# Patient Record
Sex: Male | Born: 2005 | Race: Black or African American | Hispanic: No | Marital: Single
Health system: Southern US, Community
[De-identification: ages and names within clinical notes are randomized; demographics above are authoritative.]

---
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
## 2023-04-14 IMAGING — US US SCROTUM W/ DOPPLER COMPLETE
1 series · 14 of 25 positions shown · non-contrast
Comparison: None.

CLINICAL DATA: Right groin pain

EXAM:
SCROTAL ULTRASOUND
DOPPLER ULTRASOUND OF THE TESTICLES
TECHNIQUE: Complete ultrasound examination of the testicles, epididymis, and
other scrotal structures was performed. Color and spectral Doppler
ultrasound were also utilized to evaluate blood flow to the
testicles.

[Series 1: us scrotum w/doppler · 14 of 163 slices shown]
[im 1/163]
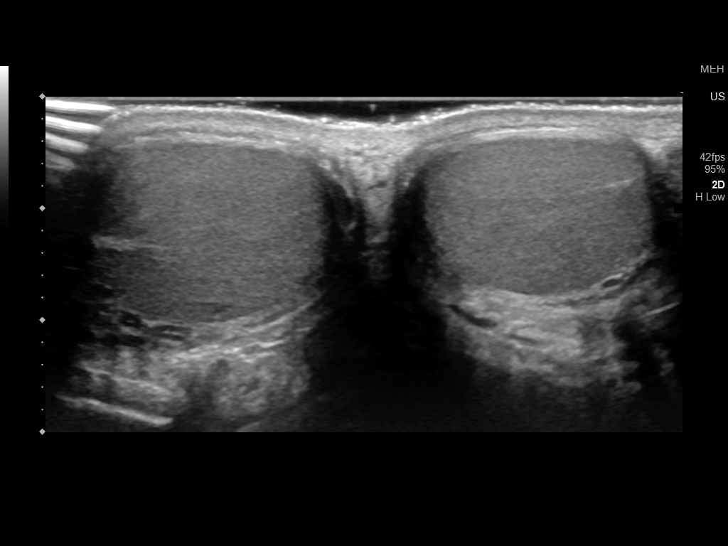
[im 14/163]
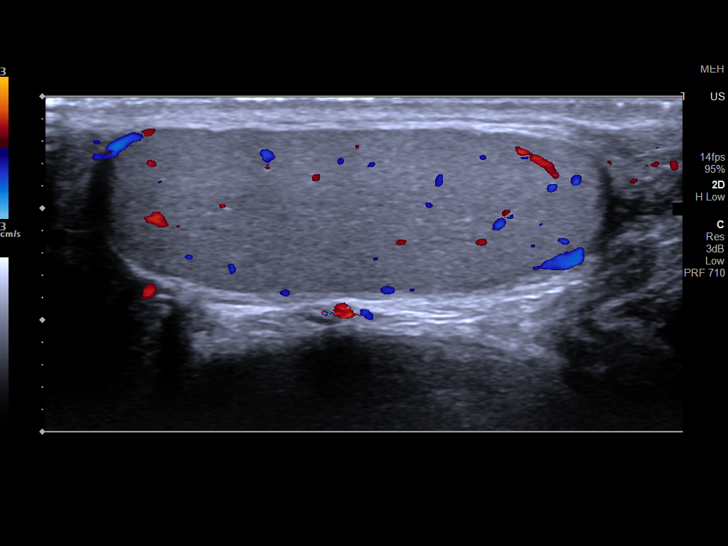
[im 28/163]
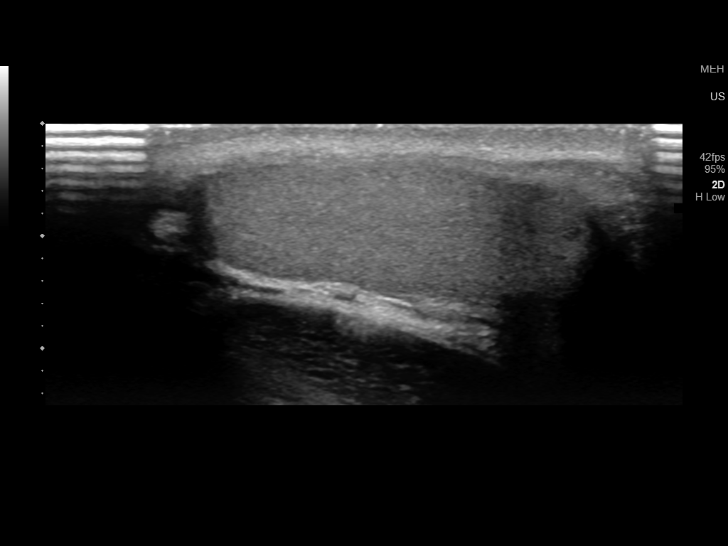
[im 41/163]
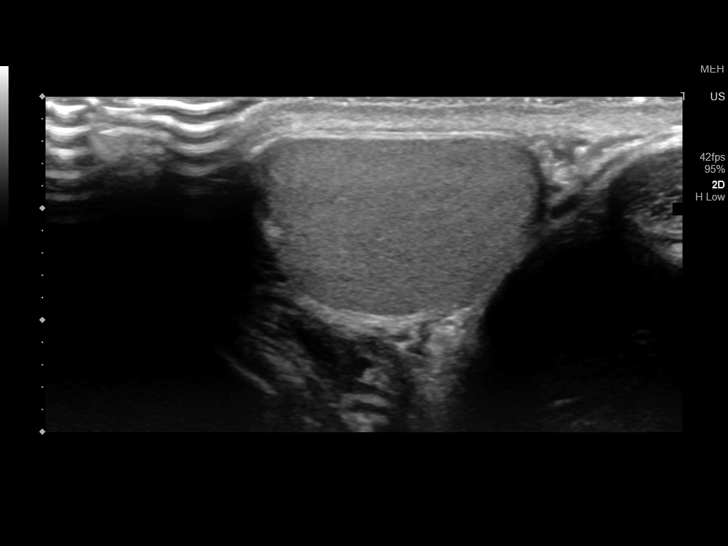
[im 55/163]
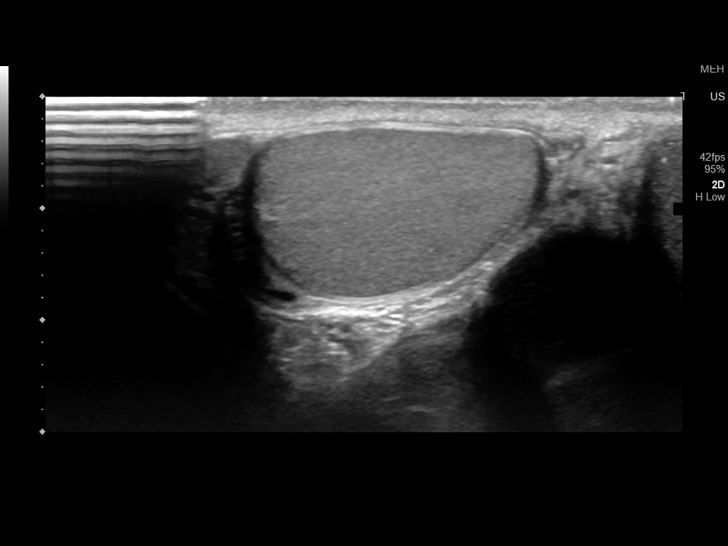
[im 61/163]
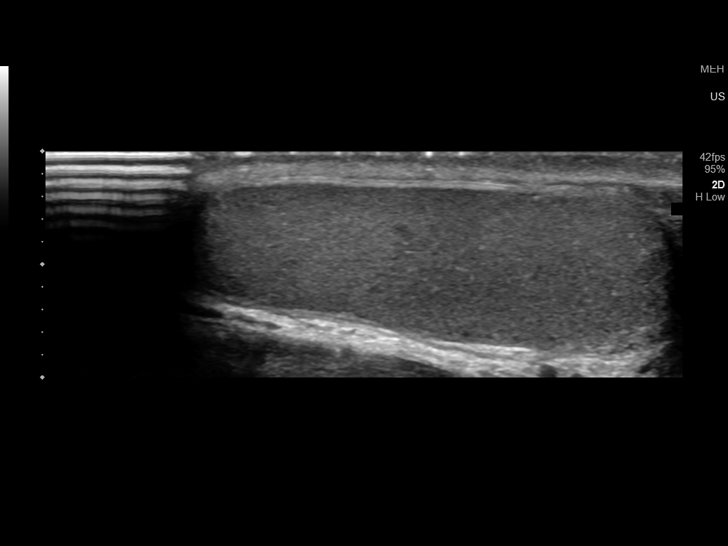
[im 75/163]
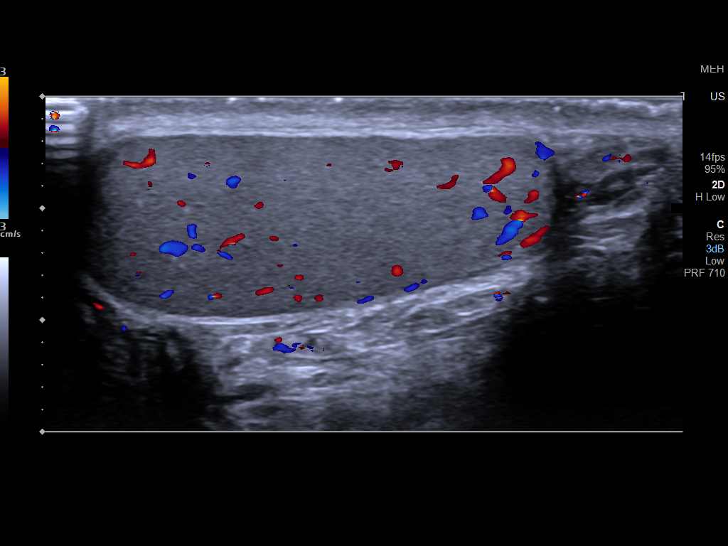
[im 88/163]
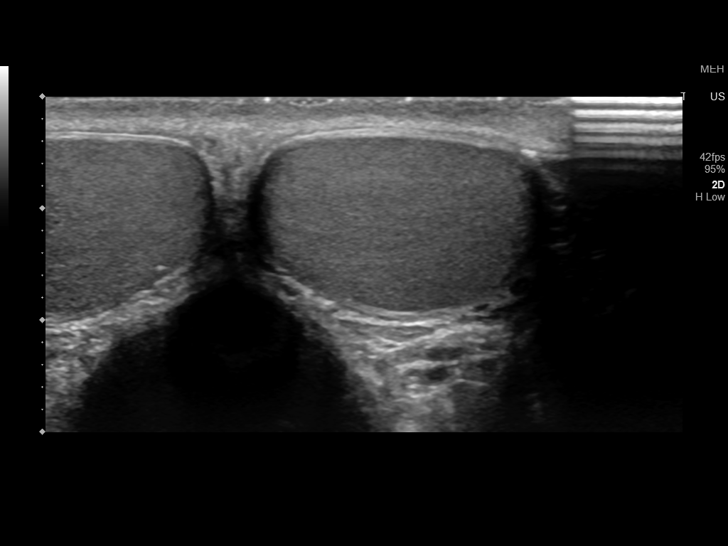
[im 102/163]
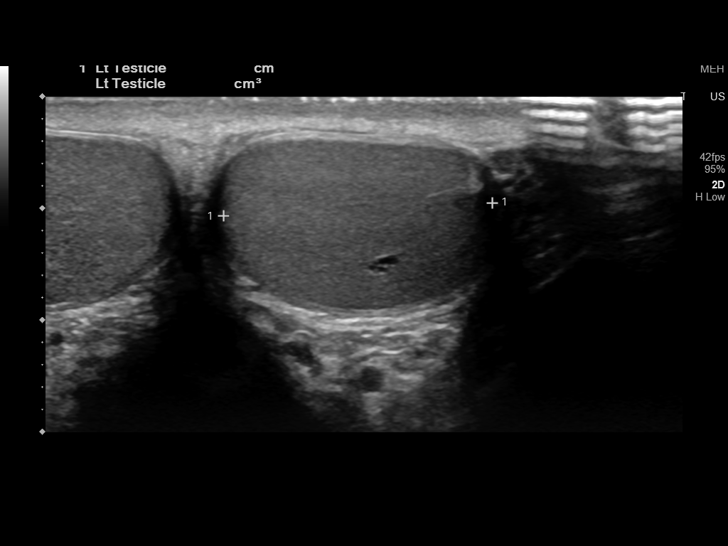
[im 109/163]
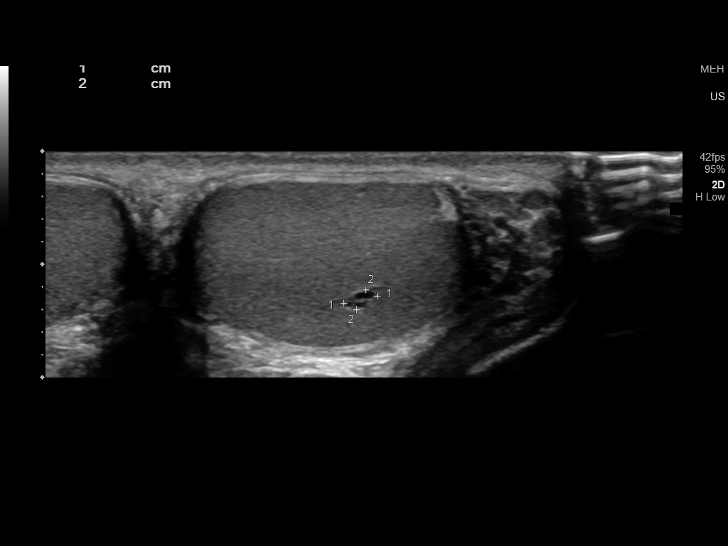
[im 122/163]
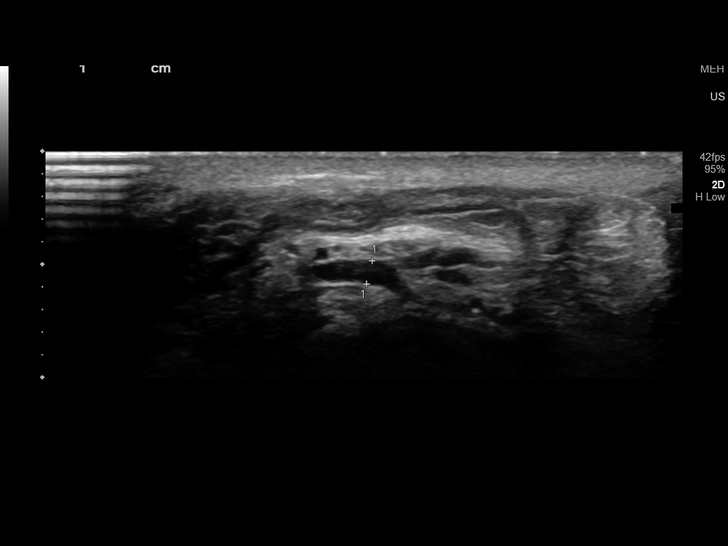
[im 136/163]
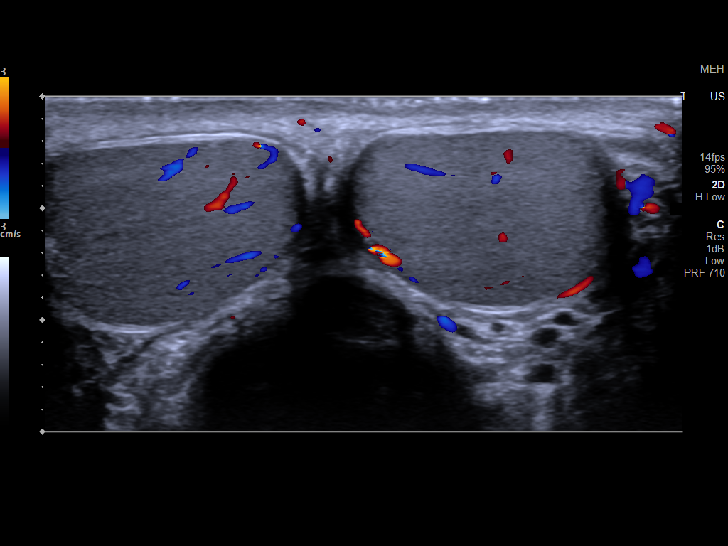
[im 149/163]
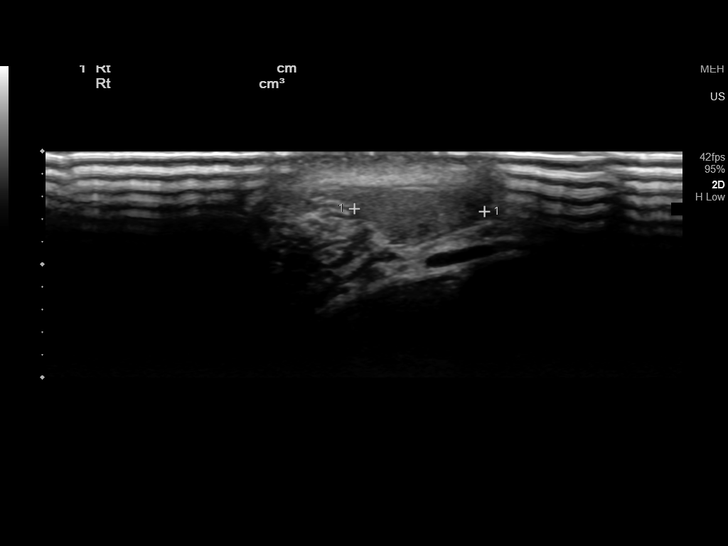
[im 163/163]
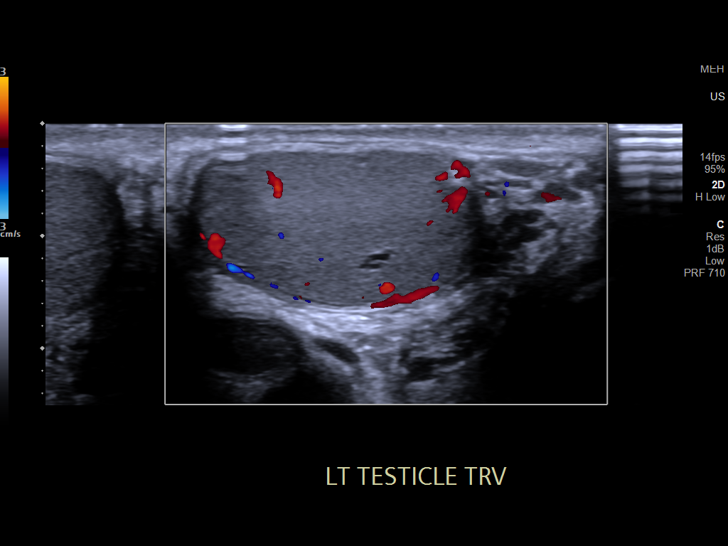

[14 of 25 positions shown; findings below may reference images not displayed]

FINDINGS: Right testicle

Measurements: 4.4 x 1.6 x 2.5 cm. No mass or microlithiasis
visualized.

Left testicle

Measurements: 4.2 x 1.6 x 2.4 cm. No mass or microlithiasis
visualized. 3 mm benign-appearing cyst.

Right epididymis:  Normal in size and appearance.

Left epididymis:  Normal in size and appearance.

Hydrocele:  None visualized.

Varicocele:  None visualized.

Pulsed Doppler interrogation of both testes demonstrates normal low
resistance arterial and venous waveforms bilaterally.
IMPRESSION: No acute findings or significant abnormality. No evidence of
testicular mass or torsion.
